# Patient Record
Sex: Female | Born: 1965 | Race: White | Hispanic: No | Marital: Married | Smoking: Former smoker
Health system: Southern US, Community
[De-identification: ages and names within clinical notes are randomized; demographics above are authoritative.]

## PROBLEM LIST (undated history)

## (undated) DIAGNOSIS — E119 Type 2 diabetes mellitus without complications: Secondary | ICD-10-CM

## (undated) DIAGNOSIS — I10 Essential (primary) hypertension: Secondary | ICD-10-CM

## (undated) DIAGNOSIS — T8859XA Other complications of anesthesia, initial encounter: Secondary | ICD-10-CM

## (undated) DIAGNOSIS — K429 Umbilical hernia without obstruction or gangrene: Secondary | ICD-10-CM

## (undated) DIAGNOSIS — T4145XA Adverse effect of unspecified anesthetic, initial encounter: Secondary | ICD-10-CM

## (undated) DIAGNOSIS — K219 Gastro-esophageal reflux disease without esophagitis: Secondary | ICD-10-CM

## (undated) HISTORY — PX: LUMBAR DISC SURGERY: SHX700

## (undated) HISTORY — PX: ADENOIDECTOMY: SUR15

---

## 2003-10-27 ENCOUNTER — Ambulatory Visit: Payer: Self-pay | Admitting: Family Medicine

## 2005-04-06 ENCOUNTER — Ambulatory Visit: Payer: Self-pay | Admitting: Family Medicine

## 2007-08-28 ENCOUNTER — Ambulatory Visit: Payer: Self-pay

## 2007-09-06 ENCOUNTER — Ambulatory Visit: Payer: Self-pay

## 2008-05-04 ENCOUNTER — Ambulatory Visit: Payer: Self-pay | Admitting: Family Medicine

## 2008-11-05 ENCOUNTER — Ambulatory Visit: Payer: Self-pay | Admitting: Obstetrics and Gynecology

## 2009-01-09 HISTORY — PX: BREAST LUMPECTOMY: SHX2

## 2009-05-25 ENCOUNTER — Ambulatory Visit: Payer: Self-pay

## 2009-06-04 ENCOUNTER — Ambulatory Visit: Payer: Self-pay | Admitting: Surgery

## 2009-06-10 ENCOUNTER — Ambulatory Visit: Payer: Self-pay | Admitting: Surgery

## 2010-07-01 ENCOUNTER — Ambulatory Visit: Payer: Self-pay

## 2011-09-13 ENCOUNTER — Ambulatory Visit: Payer: Self-pay

## 2012-10-09 ENCOUNTER — Ambulatory Visit: Payer: Self-pay

## 2013-11-27 ENCOUNTER — Ambulatory Visit: Payer: Self-pay

## 2014-11-04 ENCOUNTER — Other Ambulatory Visit: Payer: Self-pay | Admitting: Obstetrics and Gynecology

## 2014-11-04 DIAGNOSIS — Z1231 Encounter for screening mammogram for malignant neoplasm of breast: Secondary | ICD-10-CM

## 2014-11-30 ENCOUNTER — Ambulatory Visit
Admission: RE | Admit: 2014-11-30 | Discharge: 2014-11-30 | Disposition: A | Discharge status: Home / Self Care | Payer: BC Managed Care – PPO | Source: Ambulatory Visit | Attending: Obstetrics and Gynecology | Admitting: Obstetrics and Gynecology

## 2014-11-30 ENCOUNTER — Other Ambulatory Visit: Payer: Self-pay | Admitting: Obstetrics and Gynecology

## 2014-11-30 DIAGNOSIS — Z1231 Encounter for screening mammogram for malignant neoplasm of breast: Secondary | ICD-10-CM | POA: Insufficient documentation

## 2015-11-11 ENCOUNTER — Other Ambulatory Visit: Payer: Self-pay | Admitting: Obstetrics and Gynecology

## 2015-11-11 DIAGNOSIS — Z1231 Encounter for screening mammogram for malignant neoplasm of breast: Secondary | ICD-10-CM

## 2015-12-23 ENCOUNTER — Ambulatory Visit
Admission: RE | Admit: 2015-12-23 | Discharge: 2015-12-23 | Disposition: A | Discharge status: Home / Self Care | Payer: BC Managed Care – PPO | Source: Ambulatory Visit | Attending: Obstetrics and Gynecology | Admitting: Obstetrics and Gynecology

## 2015-12-23 DIAGNOSIS — Z1231 Encounter for screening mammogram for malignant neoplasm of breast: Secondary | ICD-10-CM | POA: Diagnosis present

## 2016-11-14 ENCOUNTER — Other Ambulatory Visit: Payer: Self-pay | Admitting: Obstetrics and Gynecology

## 2016-11-14 DIAGNOSIS — Z1231 Encounter for screening mammogram for malignant neoplasm of breast: Secondary | ICD-10-CM

## 2017-01-17 ENCOUNTER — Ambulatory Visit
Admission: RE | Admit: 2017-01-17 | Discharge: 2017-01-17 | Disposition: A | Discharge status: Home / Self Care | Payer: BC Managed Care – PPO | Source: Ambulatory Visit | Attending: Obstetrics and Gynecology | Admitting: Obstetrics and Gynecology

## 2017-01-17 DIAGNOSIS — Z1231 Encounter for screening mammogram for malignant neoplasm of breast: Secondary | ICD-10-CM | POA: Insufficient documentation

## 2017-01-17 NOTE — H&P (Signed)
Julie Marks is a 52 y.o. female here for Follow-up (u/s follow up pmb) . HPI:  Pt presents for a preoperative visit to schedule a D&C, hysteroscopy, and polypectomy.  F/u for Abnormal uterine bleeding, present intermittently for 2 years.  FSH on 11/14/16 = 40.1  She has a hx of: BMI 46  Workup has included: tvus today  Fibroid ut seen  1 fundal lt=25 mm 2 hyperechoic mass rt lat=12 mm - appears solid with   Endometrium irregular Thickened=15.63 mm  Neither ov seen    No EMBX, because if neg I still will want a thorough endometrial assessment  Past Medical History:  has a past medical history of Diabetes mellitus type 2, uncomplicated (CMS-HCC), Dyspepsia, Gout, History of cyst of breast, History of hemorrhoids, Hyperlipidemia, unspecified, Hypertension, Obesity, unspecified, and Osteoarthrosis.  Past Surgical History:  has a past surgical history that includes Unlisted Procedure Pharynx/Adenoids/Tonsils (1968) and lumb discectomy cervicle/lumbar multiple levels (2006). Family History: family history includes Breast cancer in her mother; Diabetes in her father and mother; Heart disease in her father and mother; High blood pressure (Hypertension) in her father and mother; Lung cancer in her father. Social History:  reports that she has never smoked. She has never used smokeless tobacco. She reports that she drinks alcohol. She reports that she does not use drugs. OB/GYN History:          OB History    Gravida  0   Para  0   Term  0   Preterm  0   AB  0   Living  0     SAB  0   TAB  0   Ectopic  0   Molar      Multiple  0   Live Births             Allergies: is allergic to nitrous oxide and others. Medications:  Current Outpatient Medications:  .  allopurinol (ZYLOPRIM) 100 MG tablet, , Disp: , Rfl: 5 .  amLODIPine (NORVASC) 10 MG tablet, Take 10 mg by mouth., Disp: , Rfl:  .  losartan (COZAAR) 100 MG tablet, Take 100 mg by mouth.,  Disp: , Rfl:  .  metFORMIN (GLUCOPHAGE) 1000 MG tablet, 500 mg once daily. TAKE 1 TABLET BY MOUTH ONCE DAILY. , Disp: , Rfl:  .  omega-3 fatty acids/fish oil 340-1,000 mg capsule, Take 1 capsule by mouth 2 (two) times daily., Disp: , Rfl:  .  omeprazole (PRILOSEC) 20 MG DR capsule, Take 20 mg by mouth once daily., Disp: , Rfl:  .  sertraline (ZOLOFT) 100 MG tablet, , Disp: , Rfl: 4 .  sertraline (ZOLOFT) 25 MG tablet, Take 25 mg by mouth once daily., Disp: , Rfl:  .  turmeric-turmeric root extract 450-50 mg Cap, Take by mouth., Disp: , Rfl:   Review of Systems: No SOB, no palpitations or chest pain, no new lower extremity edema, no nausea or vomiting or bowel or bladder complaints. See HPI for gyn specific ROS.   Exam:   Vitals:   01/17/17 1138  BP: (!) 168/99  Pulse: 76    WDWN   female in NAD Body mass index is 46.22 kg/m (pended).  General: Patient is well-groomed, well-nourished, appears stated age in no acute distress  HEENT: head is atraumatic and normocephalic, trachea is midline, neck is supple with no palpable nodules  CV: Regular rhythm and normal heart rate, no murmur  Pulm: Clear to auscultation throughout lung fields with no wheezing,  crackles, or rhonchi. No increased work of breathing  Abdomen: soft , no mass, non-tender, no rebound tenderness, no hepatomegaly  Pelvic: Deferred  Impression:   The primary encounter diagnosis was Endometrial stripe increased. A diagnosis of Post-menopausal bleeding was also pertinent to this visit.    Plan:   -  Preoperative visit: D&C hysteroscopy, with possible myosure. Consents signed today. Risks of surgery were discussed with the patient including but not limited to: bleeding which may require transfusion; infection which may require antibiotics; injury to uterus or surrounding organs; intrauterine scarring which may impair future fertility; need for additional procedures including laparotomy or  laparoscopy; and other postoperative/anesthesia complications. Written informed consent was obtained.  This is a scheduled same-day surgery. She will have a postop visit in 2 weeks to review operative findings and pathology.

## 2017-01-18 ENCOUNTER — Encounter
Admission: RE | Admit: 2017-01-18 | Discharge: 2017-01-18 | Disposition: A | Discharge status: Home / Self Care | Payer: BC Managed Care – PPO | Source: Ambulatory Visit | Attending: Obstetrics and Gynecology | Admitting: Obstetrics and Gynecology

## 2017-01-18 ENCOUNTER — Other Ambulatory Visit: Payer: Self-pay

## 2017-01-18 HISTORY — DX: Other complications of anesthesia, initial encounter: T88.59XA

## 2017-01-18 HISTORY — DX: Gastro-esophageal reflux disease without esophagitis: K21.9

## 2017-01-18 HISTORY — DX: Essential (primary) hypertension: I10

## 2017-01-18 HISTORY — DX: Umbilical hernia without obstruction or gangrene: K42.9

## 2017-01-18 HISTORY — DX: Adverse effect of unspecified anesthetic, initial encounter: T41.45XA

## 2017-01-18 HISTORY — DX: Type 2 diabetes mellitus without complications: E11.9

## 2017-01-18 NOTE — Patient Instructions (Signed)
  Your procedure is scheduled on: 01-19-17 Report to Same Day Surgery 2nd floor medical mall [facility](Medical Mall Entrance-take elevator on left to 2nd floor.  Check in with surgery information desk.) @ 11:30 AM PER PT  Remember: Instructions that are not followed completely may result in serious medical risk, up to and including death, or upon the discretion of your surgeon and anesthesiologist your surgery may need to be rescheduled.    _x___ 1. Do not eat food after midnight the night before your procedure. NO GUM OR CANDY AFTER MIDNIGHT.  You may drink WATER up to 2 hours before you are scheduled to arrive at the hospital for your procedure.  Do not drink WATER within 2 hours of your scheduled arrival to the hospital.  Type 1 and type 2 diabetics should only drink water.      __x__ 2. No Alcohol for 24 hours before or after surgery.   __x__3. No Smoking for 24 prior to surgery.   ____  4. Bring all medications with you on the day of surgery if instructed.    __x__ 5. Notify your doctor if there is any change in your medical condition     (cold, fever, infections).     Do not wear jewelry, make-up, hairpins, clips or nail polish.  Do not wear lotions, powders, or perfumes. You may wear deodorant.  Do not shave 48 hours prior to surgery. Men may shave face and neck.  Do not bring valuables to the hospital.    [facility] any belongings or valuables.               Contacts, dentures or bridgework may not be worn into surgery.  Leave your suitcase in the car. After surgery it may be brought to your room.  For patients admitted to the hospital, discharge time is determined by your treatment team.   Patients discharged the day of surgery will not be allowed to drive home.  You will need someone to drive you home and stay with you the night of your procedure.     _x___ TAKE THE FOLLOWING MEDICATIONS WITH A SMALL SIP OF WATER. These include:  1. ALLOPURINOL  2.  AMLODIPINE  3. PRILOSEC  4. ZOLOFT  5.  6.  ____Fleets enema or Magnesium Citrate as directed.   ____ Use CHG Soap or sage wipes as directed on instruction sheet   ____ Use inhalers on the day of surgery and bring to hospital day of surgery  __X__ Stop Metformin 2 days prior to surgery-PT LAST HAD METFORMIN TODAY IN AM (01-18-17)    ____ Take 1/2 of usual insulin dose the night before surgery and none on the morning surgery.   ____ Follow recommendations from Cardiologist, Pulmonologist or PCP regarding  stopping Aspirin, Coumadin, Plavix ,Eliquis, Effient, or Pradaxa, and Pletal.  X____Stop Anti-inflammatories such as Advil, Aleve, Ibuprofen, Motrin, Naproxen, Naprosyn, Goodies powders or aspirin products NOW-OK to take Tylenol    ____ Stop supplements until after surgery.    ____ Bring C-Pap to the hospital.

## 2017-01-19 ENCOUNTER — Encounter
Admission: RE | Disposition: A | Discharge status: Home / Self Care | Payer: Self-pay | Source: Ambulatory Visit | Attending: Obstetrics and Gynecology

## 2017-01-19 ENCOUNTER — Ambulatory Visit
Admission: RE | Admit: 2017-01-19 | Discharge: 2017-01-19 | Disposition: A | Discharge status: Home / Self Care | Payer: BC Managed Care – PPO | Source: Ambulatory Visit | Attending: Obstetrics and Gynecology | Admitting: Obstetrics and Gynecology

## 2017-01-19 ENCOUNTER — Other Ambulatory Visit: Payer: Self-pay

## 2017-01-19 ENCOUNTER — Ambulatory Visit: Payer: BC Managed Care – PPO | Admitting: Anesthesiology

## 2017-01-19 ENCOUNTER — Encounter: Payer: Self-pay | Admitting: *Deleted

## 2017-01-19 DIAGNOSIS — Z79899 Other long term (current) drug therapy: Secondary | ICD-10-CM | POA: Insufficient documentation

## 2017-01-19 DIAGNOSIS — Z6841 Body Mass Index (BMI) 40.0 and over, adult: Secondary | ICD-10-CM | POA: Insufficient documentation

## 2017-01-19 DIAGNOSIS — M1990 Unspecified osteoarthritis, unspecified site: Secondary | ICD-10-CM | POA: Diagnosis not present

## 2017-01-19 DIAGNOSIS — N8500 Endometrial hyperplasia, unspecified: Secondary | ICD-10-CM | POA: Insufficient documentation

## 2017-01-19 DIAGNOSIS — Z7984 Long term (current) use of oral hypoglycemic drugs: Secondary | ICD-10-CM | POA: Diagnosis not present

## 2017-01-19 DIAGNOSIS — E119 Type 2 diabetes mellitus without complications: Secondary | ICD-10-CM | POA: Diagnosis not present

## 2017-01-19 DIAGNOSIS — N950 Postmenopausal bleeding: Secondary | ICD-10-CM | POA: Diagnosis present

## 2017-01-19 DIAGNOSIS — I10 Essential (primary) hypertension: Secondary | ICD-10-CM | POA: Diagnosis not present

## 2017-01-19 DIAGNOSIS — E669 Obesity, unspecified: Secondary | ICD-10-CM | POA: Insufficient documentation

## 2017-01-19 DIAGNOSIS — E785 Hyperlipidemia, unspecified: Secondary | ICD-10-CM | POA: Insufficient documentation

## 2017-01-19 DIAGNOSIS — N840 Polyp of corpus uteri: Secondary | ICD-10-CM | POA: Diagnosis not present

## 2017-01-19 HISTORY — PX: DILATATION & CURETTAGE/HYSTEROSCOPY WITH MYOSURE: SHX6511

## 2017-01-19 LAB — CBC
HCT: 41 % (ref 35.0–47.0)
HEMOGLOBIN: 13.5 g/dL (ref 12.0–16.0)
MCH: 27.7 pg (ref 26.0–34.0)
MCHC: 32.9 g/dL (ref 32.0–36.0)
MCV: 84.2 fL (ref 80.0–100.0)
PLATELETS: 195 10*3/uL (ref 150–440)
RBC: 4.87 MIL/uL (ref 3.80–5.20)
RDW: 15.9 % — ABNORMAL HIGH (ref 11.5–14.5)
WBC: 8.6 10*3/uL (ref 3.6–11.0)

## 2017-01-19 LAB — BASIC METABOLIC PANEL
ANION GAP: 9 (ref 5–15)
BUN: 17 mg/dL (ref 6–20)
CHLORIDE: 110 mmol/L (ref 101–111)
CO2: 23 mmol/L (ref 22–32)
Calcium: 9.3 mg/dL (ref 8.9–10.3)
Creatinine, Ser: 0.93 mg/dL (ref 0.44–1.00)
Glucose, Bld: 112 mg/dL — ABNORMAL HIGH (ref 65–99)
POTASSIUM: 3.9 mmol/L (ref 3.5–5.1)
SODIUM: 142 mmol/L (ref 135–145)

## 2017-01-19 LAB — GLUCOSE, CAPILLARY
GLUCOSE-CAPILLARY: 104 mg/dL — AB (ref 65–99)
Glucose-Capillary: 91 mg/dL (ref 65–99)

## 2017-01-19 LAB — TYPE AND SCREEN
ABO/RH(D): O POS
ANTIBODY SCREEN: NEGATIVE

## 2017-01-19 LAB — POCT PREGNANCY, URINE: PREG TEST UR: NEGATIVE

## 2017-01-19 LAB — ABO/RH: ABO/RH(D): O POS

## 2017-01-19 SURGERY — DILATATION & CURETTAGE/HYSTEROSCOPY WITH MYOSURE
Anesthesia: Spinal | Site: Cervix | Wound class: Clean Contaminated

## 2017-01-19 MED ORDER — PROPOFOL 500 MG/50ML IV EMUL
INTRAVENOUS | Status: DC | PRN
  Administered 2017-01-19: 50 ug/kg/min via INTRAVENOUS

## 2017-01-19 MED ORDER — GABAPENTIN 800 MG PO TABS: 800.0000 mg | ORAL_TABLET | Freq: Every day | ORAL | 0 refills | Status: AC

## 2017-01-19 MED ORDER — ONDANSETRON HCL 4 MG/2ML IJ SOLN: 4.0000 mg | Freq: Once | INTRAMUSCULAR | Status: DC | PRN

## 2017-01-19 MED ORDER — MIDAZOLAM HCL 5 MG/5ML IJ SOLN
INTRAMUSCULAR | Status: DC | PRN
  Administered 2017-01-19 (×2): 1 mg via INTRAVENOUS

## 2017-01-19 MED ORDER — DEXAMETHASONE SODIUM PHOSPHATE 10 MG/ML IJ SOLN
INTRAMUSCULAR | Status: AC
  Filled 2017-01-19: qty 1

## 2017-01-19 MED ORDER — PROPOFOL 10 MG/ML IV BOLUS
INTRAVENOUS | Status: AC
  Filled 2017-01-19: qty 20

## 2017-01-19 MED ORDER — FENTANYL CITRATE (PF) 100 MCG/2ML IJ SOLN
INTRAMUSCULAR | Status: AC
  Filled 2017-01-19: qty 2

## 2017-01-19 MED ORDER — DOCUSATE SODIUM 100 MG PO CAPS: 100.0000 mg | ORAL_CAPSULE | Freq: Two times a day (BID) | ORAL | 0 refills | Status: AC

## 2017-01-19 MED ORDER — FENTANYL CITRATE (PF) 100 MCG/2ML IJ SOLN
INTRAMUSCULAR | Status: DC | PRN
  Administered 2017-01-19 (×2): 25 ug via INTRAVENOUS

## 2017-01-19 MED ORDER — GLYCOPYRROLATE 0.2 MG/ML IJ SOLN
INTRAMUSCULAR | Status: AC
  Filled 2017-01-19: qty 1

## 2017-01-19 MED ORDER — IBUPROFEN 800 MG PO TABS: 800.0000 mg | ORAL_TABLET | Freq: Three times a day (TID) | ORAL | 1 refills | Status: AC | PRN

## 2017-01-19 MED ORDER — MIDAZOLAM HCL 2 MG/2ML IJ SOLN
INTRAMUSCULAR | Status: AC
  Filled 2017-01-19: qty 2

## 2017-01-19 MED ORDER — KETOROLAC TROMETHAMINE 30 MG/ML IJ SOLN
INTRAMUSCULAR | Status: DC | PRN
  Administered 2017-01-19: 30 mg via INTRAVENOUS

## 2017-01-19 MED ORDER — SUCCINYLCHOLINE CHLORIDE 20 MG/ML IJ SOLN
INTRAMUSCULAR | Status: AC
  Filled 2017-01-19: qty 1

## 2017-01-19 MED ORDER — KETOROLAC TROMETHAMINE 30 MG/ML IJ SOLN
INTRAMUSCULAR | Status: AC
  Filled 2017-01-19: qty 1

## 2017-01-19 MED ORDER — LIDOCAINE HCL (PF) 2 % IJ SOLN
INTRAMUSCULAR | Status: AC
  Filled 2017-01-19: qty 10

## 2017-01-19 MED ORDER — ONDANSETRON HCL 4 MG/2ML IJ SOLN
INTRAMUSCULAR | Status: AC
  Filled 2017-01-19: qty 2

## 2017-01-19 MED ORDER — LIDOCAINE HCL (PF) 1 % IJ SOLN
INTRAMUSCULAR | Status: AC
  Filled 2017-01-19: qty 30

## 2017-01-19 MED ORDER — BUPIVACAINE IN DEXTROSE 0.75-8.25 % IT SOLN
INTRATHECAL | Status: DC | PRN
  Administered 2017-01-19: 1.1 mL via INTRATHECAL

## 2017-01-19 MED ORDER — LIDOCAINE-EPINEPHRINE 1 %-1:100000 IJ SOLN
INTRAMUSCULAR | Status: AC
  Filled 2017-01-19: qty 1

## 2017-01-19 MED ORDER — PROPOFOL 10 MG/ML IV BOLUS
INTRAVENOUS | Status: DC | PRN
  Administered 2017-01-19: 50 mg via INTRAVENOUS

## 2017-01-19 MED ORDER — LACTATED RINGERS IV SOLN: INTRAVENOUS | Status: DC

## 2017-01-19 MED ORDER — FENTANYL CITRATE (PF) 100 MCG/2ML IJ SOLN: 25.0000 ug | INTRAMUSCULAR | Status: DC | PRN

## 2017-01-19 MED ORDER — SODIUM CHLORIDE 0.9 % IV SOLN
INTRAVENOUS | Status: DC
  Administered 2017-01-19: 14:00:00 via INTRAVENOUS

## 2017-01-19 SURGICAL SUPPLY — 20 items
ABLATOR ENDOMETRIAL MYOSURE (ABLATOR) ×3 IMPLANT
CANISTER SUC SOCK COL 7IN (MISCELLANEOUS) ×3 IMPLANT
CANISTER SUCT 3000ML PPV (MISCELLANEOUS) ×3 IMPLANT
CATH ROBINSON RED A/P 16FR (CATHETERS) ×3 IMPLANT
CUP MEDICINE 2OZ PLAST GRAD ST (MISCELLANEOUS) ×3 IMPLANT
DEVICE MYOSURE LITE (MISCELLANEOUS) IMPLANT
DRAPE SURG 17X11 SM STRL (DRAPES) ×3 IMPLANT
GLOVE BIO SURGEON STRL SZ7 (GLOVE) ×9 IMPLANT
GLOVE INDICATOR 7.5 STRL GRN (GLOVE) ×3 IMPLANT
GOWN STRL REUS W/ TWL LRG LVL3 (GOWN DISPOSABLE) ×2 IMPLANT
GOWN STRL REUS W/TWL LRG LVL3 (GOWN DISPOSABLE) ×4
KIT RM TURNOVER CYSTO AR (KITS) ×3 IMPLANT
PACK DNC HYST (MISCELLANEOUS) ×3 IMPLANT
PAD OB MATERNITY 4.3X12.25 (PERSONAL CARE ITEMS) ×3 IMPLANT
PAD PREP 24X41 OB/GYN DISP (PERSONAL CARE ITEMS) ×3 IMPLANT
SOL .9 NS 3000ML IRR  AL (IV SOLUTION) ×2
SOL .9 NS 3000ML IRR UROMATIC (IV SOLUTION) ×1 IMPLANT
TUBING CONNECTING 10 (TUBING) ×2 IMPLANT
TUBING CONNECTING 10' (TUBING) ×1
TUBING HYSTEROSCOPY DOLPHIN (MISCELLANEOUS) ×6 IMPLANT

## 2017-01-19 NOTE — OR Nursing (Signed)
Pt sitting up in recliner. States no discomfort. States bottom of feet remain numb. Eating and drinking well. Friend with pt. Denies discomfort.

## 2017-01-19 NOTE — OR Nursing (Signed)
Pt able to stand only with assistance on arrival to SDS. Discussed discharge instructions with pts friend. Voices understanding. Discussed with pt and friend need to walk and void before discharge. Both voice understanding.

## 2017-01-19 NOTE — OR Nursing (Signed)
Dr. Jarvis MorganAdam's has viewed 12 lead EKG from today. After discussion with Dr. Malena EdmanBeasly he reports he will do a spinal on this patient.

## 2017-01-19 NOTE — Anesthesia Procedure Notes (Signed)
Spinal  Patient location during procedure: OR Staffing Performed: anesthesiologist  Preanesthetic Checklist Completed: patient identified, site marked, surgical consent, pre-op evaluation, timeout performed, IV checked, risks and benefits discussed and monitors and equipment checked Spinal Block Patient position: sitting Prep: Betadine Patient monitoring: heart rate, continuous pulse ox, blood pressure and cardiac monitor Approach: midline Location: L4-5 Injection technique: single-shot Needle Needle type: Whitacre and Introducer  Needle gauge: 24 G Needle length: 10 cm Assessment Sensory level: T12 Additional Notes Negative paresthesia. Negative blood return. Positive free-flowing CSF. Expiration date of kit checked and confirmed. Patient tolerated procedure well, without complications.

## 2017-01-19 NOTE — Op Note (Signed)
Operative Report Hysteroscopy with Dilation and Curettage   Indications: Postmenopausal bleeding   Pre-operative Diagnosis: Thickened endometrial stripe   Post-operative Diagnosis: same.  Procedure: 1. Exam under anesthesia 2. Fractional D&C 3. Hysteroscopy 4. Myosure polypectomy  Surgeon: Christeen DouglasBethany Elynor Kallenberger, MD  Assistant(s):  None  Anesthesia: Spinal anesthesia and iv sedation  Anesthesiologist: No responsible provider has been recorded for the case. Anesthesiologist: Yevette EdwardsAdams, James G, MD CRNA: Ginger CarneMichelet, Stephanie, CRNA; Omer JackWeatherly, Janice, CRNA  Estimated Blood Loss:  Minimal         Intraoperative medications: toradol         Total IV Fluids: 700ml  Urine Output: 100ml  Total Fluid Deficit:  200 mL          Specimens: Endocervical curettings, endometrial curettings         Complications:  None; patient tolerated the procedure well.         Disposition: PACU - hemodynamically stable.         Condition: stable  Findings: Uterus measuring 7 cm by sound; normal cervix, vagina, perineum. Multiple endometrial polyp  Indication for procedure/Consents: 52 y.o. G0  here for scheduled surgery for the aforementioned diagnoses.  Risks of surgery were discussed with the patient including but not limited to: bleeding which may require transfusion; infection which may require antibiotics; injury to uterus or surrounding organs; intrauterine scarring which may impair future fertility; need for additional procedures including laparotomy or laparoscopy; and other postoperative/anesthesia complications. Written informed consent was obtained.     D&C/ Myosure  The patient was taken to the operating room where anesthesia was administered and was found to be adequate. After a formal and adequate timeout was performed, she was placed in the dorsal lithotomy position and examined with the above findings. She was then prepped and draped in the sterile manner. Her bladder was catheterized for  an estimated amount of clear, yellow urine. A weighed speculum was then placed in the patient's vagina and a single tooth tenaculum was applied to the anterior lip of the cervix.  Her cervix was serially dilated to 15 [place]French using Hanks dilators. An ECC was performed. The hysteroscope was introduced under direct observation  Using lactated ringers as a distention medium to reveal the above findings. The uterine cavity was carefully examined, both ostia were recognized, and diffusely proliferative endometrium with the polyp was noted.   This was resected using the Myosure device.  After further careful visualization of the uterine cavity, the hysteroscope was removed under direct visualization.  A sharp curettage was then performed until there was a gritty texture in all four quadrants. The tenaculum was removed from the anterior lip of the cervix and the vaginal speculum was removed after applying silver nitrate for good hemostasis.   The patient tolerated the procedure well and was taken to the recovery area awake and in stable condition. She received iv acetaminophen and Toradol prior to leaving the OR.  The patient will be discharged to home as per PACU criteria. Routine postoperative instructions given. She was prescribed Ibuprofen and Colace. She will follow up in the clinic in two weeks for postoperative evaluation.

## 2017-01-19 NOTE — OR Nursing (Signed)
Pt continues to be unable to feel bottom of feet. Dr Dalbert GarnetBeasley in to see pt..Marland Kitchen

## 2017-01-19 NOTE — Anesthesia Preprocedure Evaluation (Addendum)
Anesthesia Evaluation  Patient identified by MRN, date of birth, ID band Patient awake    Reviewed: Allergy & Precautions, H&P , NPO status , Patient's Chart, lab work & pertinent test results, reviewed documented beta blocker date and time   History of Anesthesia Complications (+) PONV and history of anesthetic complications  Airway Mallampati: II   Neck ROM: full    Dental  (+) Teeth Intact   Pulmonary neg pulmonary ROS, former smoker,    Pulmonary exam normal        Cardiovascular hypertension, negative cardio ROS Normal cardiovascular exam Rhythm:regular Rate:Normal     Neuro/Psych negative neurological ROS  negative psych ROS   GI/Hepatic negative GI ROS, Neg liver ROS, GERD  ,  Endo/Other  negative endocrine ROSdiabetes, Well Controlled, Type 2, Oral Hypoglycemic Agents  Renal/GU negative Renal ROS  negative genitourinary   Musculoskeletal   Abdominal   Peds  Hematology negative hematology ROS (+)   Anesthesia Other Findings Past Medical History: No date: Complication of anesthesia     Comment:  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 No date: Diabetes mellitus without complication (HCC) No date: GERD (gastroesophageal reflux disease) No date: Hypertension No date: Umbilical hernia Past Surgical History: No date: ADENOIDECTOMY     Comment:  AGE 52 2011: BREAST LUMPECTOMY; Right     Comment:  benign findings No date: LUMBAR DISC SURGERY   Reproductive/Obstetrics negative OB ROS                            Anesthesia Physical Anesthesia Plan  ASA: III  Anesthesia Plan: Spinal   Post-op Pain Management:    Induction:   PONV Risk Score and Plan: 4 or greater  Airway Management Planned:   Additional Equipment:   Intra-op Plan:   Post-operative Plan:   Informed Consent: I have reviewed the patients History and Physical, chart, labs and discussed the procedure including the  risks, benefits and alternatives for the proposed anesthesia with the patient or authorized representative who has indicated his/her understanding and acceptance.   Dental Advisory Given  Plan Discussed with: CRNA  Anesthesia Plan Comments: (ekg noted.  Pt with previous work up per Dr. Lady GaryFath and Neg. EST in early 2011.  No old ekg to compare to.  Hx eval yields no evidence on unstable angina or ischemia sx or change in cardiac ROS since her EST.  Pt has occasional dyspepsia but unrelated to activity.  I feel that her cardiac status is stable and based on my discussion with the patient and Dr. B, we will proceed with a saddle block for the above.  JA)      Anesthesia Quick Evaluation

## 2017-01-19 NOTE — Anesthesia Post-op Follow-up Note (Signed)
Anesthesia QCDR form completed.        

## 2017-01-19 NOTE — Transfer of Care (Signed)
Immediate Anesthesia Transfer of Care Note  Patient: Julie RobinsonLisa D Cavey  Procedure(s) Performed: DILATATION & CURETTAGE/HYSTEROSCOPY WITH MYOSURE (N/A Cervix)  Patient Location: PACU  Anesthesia Type:Spinal  Level of Consciousness: awake, alert  and oriented  Airway & Oxygen Therapy: Patient Spontanous Breathing  Post-op Assessment: Report given to RN and Post -op Vital signs reviewed and stable  Post vital signs: Reviewed and stable  Last Vitals:  Vitals:   01/19/17 1353 01/19/17 1808  BP: (!) 157/80 (!) 153/78  Pulse: 74 64  Resp: 18 14  Temp: 36.6 C 36.9 C  SpO2: 99% 98%    Last Pain:  Vitals:   01/19/17 1353  TempSrc: Temporal         Complications: No apparent anesthesia complications

## 2017-01-19 NOTE — Interval H&P Note (Signed)
History and Physical Interval Note:  01/19/2017 2:56 PM  Julie RobinsonLisa D Marks  has presented today for surgery, with the diagnosis of postmenopausal bleeding  The various methods of treatment have been discussed with the patient and family. After consideration of risks, benefits and other options for treatment, the patient has consented to  Procedure(s): DILATATION & CURETTAGE/HYSTEROSCOPY WITH MYOSURE (N/A) as a surgical intervention. 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 The patient's history has been reviewed, patient examined, no change in status, stable for surgery.  I have reviewed the patient's chart and labs.  Questions were answered to the patient's satisfaction.     Christeen DouglasBethany Lanny Donoso

## 2017-01-21 NOTE — Anesthesia Postprocedure Evaluation (Signed)
Anesthesia Post Note  Patient: Julie Marks  Procedure(s) Performed: DILATATION & CURETTAGE/HYSTEROSCOPY WITH MYOSURE (N/A Cervix)  Patient location during evaluation: PACU Anesthesia Type: Spinal Level of consciousness: awake and alert Pain management: pain level controlled Vital Signs Assessment: post-procedure vital signs reviewed and stable Respiratory status: spontaneous breathing, nonlabored ventilation, respiratory function stable and patient connected to nasal cannula oxygen Cardiovascular status: blood pressure returned to baseline and stable Postop Assessment: no apparent nausea or vomiting Anesthetic complications: no     Last Vitals:  Vitals:   01/19/17 1953 01/19/17 2100  BP: 138/64 133/69  Pulse: 66 81  Resp:  16  Temp:  36.7 C  SpO2: 100% 99%    Last Pain:  Vitals:   01/19/17 2100  TempSrc: Oral                 Yevette EdwardsJames G Keshun Berrett

## 2017-01-22 ENCOUNTER — Encounter: Payer: Self-pay | Admitting: Obstetrics and Gynecology

## 2017-01-23 LAB — SURGICAL PATHOLOGY

## 2017-11-21 ENCOUNTER — Other Ambulatory Visit: Payer: Self-pay | Admitting: Obstetrics and Gynecology

## 2017-11-21 DIAGNOSIS — Z1231 Encounter for screening mammogram for malignant neoplasm of breast: Secondary | ICD-10-CM

## 2018-01-18 ENCOUNTER — Ambulatory Visit
Admission: RE | Admit: 2018-01-18 | Discharge: 2018-01-18 | Disposition: A | Discharge status: Home / Self Care | Payer: BC Managed Care – PPO | Source: Ambulatory Visit | Attending: Obstetrics and Gynecology | Admitting: Obstetrics and Gynecology

## 2018-01-18 DIAGNOSIS — Z1231 Encounter for screening mammogram for malignant neoplasm of breast: Secondary | ICD-10-CM | POA: Insufficient documentation

## 2019-01-07 ENCOUNTER — Other Ambulatory Visit: Payer: Self-pay | Admitting: Obstetrics and Gynecology

## 2019-01-07 DIAGNOSIS — Z1231 Encounter for screening mammogram for malignant neoplasm of breast: Secondary | ICD-10-CM

## 2019-01-27 ENCOUNTER — Ambulatory Visit
Admission: RE | Admit: 2019-01-27 | Discharge: 2019-01-27 | Disposition: A | Discharge status: Home / Self Care | Payer: BC Managed Care – PPO | Source: Ambulatory Visit | Attending: Obstetrics and Gynecology | Admitting: Obstetrics and Gynecology

## 2019-01-27 DIAGNOSIS — Z1231 Encounter for screening mammogram for malignant neoplasm of breast: Secondary | ICD-10-CM | POA: Diagnosis present

## 2020-01-08 ENCOUNTER — Other Ambulatory Visit: Payer: Self-pay | Admitting: Obstetrics and Gynecology

## 2020-01-08 DIAGNOSIS — Z1231 Encounter for screening mammogram for malignant neoplasm of breast: Secondary | ICD-10-CM

## 2020-02-02 ENCOUNTER — Other Ambulatory Visit: Payer: Self-pay

## 2020-02-02 ENCOUNTER — Ambulatory Visit
Admission: RE | Admit: 2020-02-02 | Discharge: 2020-02-02 | Disposition: A | Discharge status: Home / Self Care | Payer: BC Managed Care – PPO | Source: Ambulatory Visit | Attending: Obstetrics and Gynecology | Admitting: Obstetrics and Gynecology

## 2020-02-02 DIAGNOSIS — Z1231 Encounter for screening mammogram for malignant neoplasm of breast: Secondary | ICD-10-CM | POA: Insufficient documentation

## 2021-01-18 ENCOUNTER — Other Ambulatory Visit: Payer: Self-pay | Admitting: Obstetrics and Gynecology

## 2021-01-18 DIAGNOSIS — Z1231 Encounter for screening mammogram for malignant neoplasm of breast: Secondary | ICD-10-CM

## 2021-02-15 ENCOUNTER — Other Ambulatory Visit: Payer: Self-pay

## 2021-02-15 ENCOUNTER — Ambulatory Visit
Admission: RE | Admit: 2021-02-15 | Discharge: 2021-02-15 | Disposition: A | Discharge status: Home / Self Care | Payer: BC Managed Care – PPO | Source: Ambulatory Visit | Attending: Obstetrics and Gynecology | Admitting: Obstetrics and Gynecology

## 2021-02-15 DIAGNOSIS — Z1231 Encounter for screening mammogram for malignant neoplasm of breast: Secondary | ICD-10-CM | POA: Diagnosis not present

## 2021-12-29 ENCOUNTER — Other Ambulatory Visit: Payer: Self-pay | Admitting: Obstetrics and Gynecology

## 2021-12-29 DIAGNOSIS — Z1231 Encounter for screening mammogram for malignant neoplasm of breast: Secondary | ICD-10-CM

## 2022-02-16 ENCOUNTER — Ambulatory Visit
Admission: RE | Admit: 2022-02-16 | Discharge: 2022-02-16 | Disposition: A | Discharge status: Home / Self Care | Payer: BC Managed Care – PPO | Source: Ambulatory Visit | Attending: Obstetrics and Gynecology | Admitting: Obstetrics and Gynecology

## 2022-02-16 DIAGNOSIS — Z1231 Encounter for screening mammogram for malignant neoplasm of breast: Secondary | ICD-10-CM | POA: Diagnosis present

## 2022-04-27 LAB — COLOGUARD: COLOGUARD: NEGATIVE

## 2022-04-27 LAB — EXTERNAL GENERIC LAB PROCEDURE: COLOGUARD: NEGATIVE

## 2023-02-02 ENCOUNTER — Other Ambulatory Visit: Payer: Self-pay | Admitting: Obstetrics and Gynecology

## 2023-02-02 DIAGNOSIS — Z1231 Encounter for screening mammogram for malignant neoplasm of breast: Secondary | ICD-10-CM

## 2023-02-21 ENCOUNTER — Ambulatory Visit
Admission: RE | Admit: 2023-02-21 | Discharge: 2023-02-21 | Disposition: A | Discharge status: Home / Self Care | Payer: 59 | Source: Ambulatory Visit | Attending: Obstetrics and Gynecology | Admitting: Obstetrics and Gynecology

## 2023-02-21 DIAGNOSIS — Z1231 Encounter for screening mammogram for malignant neoplasm of breast: Secondary | ICD-10-CM | POA: Diagnosis present

## 2023-05-16 ENCOUNTER — Other Ambulatory Visit: Payer: Self-pay | Admitting: Medical Genetics

## 2023-05-24 ENCOUNTER — Other Ambulatory Visit
Admission: RE | Admit: 2023-05-24 | Discharge: 2023-05-24 | Disposition: A | Discharge status: Home / Self Care | Payer: Self-pay | Source: Ambulatory Visit | Attending: Medical Genetics | Admitting: Medical Genetics

## 2023-06-05 LAB — GENECONNECT MOLECULAR SCREEN: Genetic Analysis Overall Interpretation: NEGATIVE

## 2023-09-19 IMAGING — MG MM DIGITAL SCREENING BILAT W/ TOMO AND CAD
6 of 12 series · 6 of 36 positions shown · non-contrast
Comparison: Previous exam(s).

CLINICAL DATA: Screening.

EXAM:
DIGITAL SCREENING BILATERAL MAMMOGRAM WITH TOMOSYNTHESIS AND CAD
TECHNIQUE: Bilateral screening digital craniocaudal and mediolateral oblique
mammograms were obtained. Bilateral screening digital breast
tomosynthesis was performed. The images were evaluated with
computer-aided detection.

[R MLO synth-2D (1 of 2)]
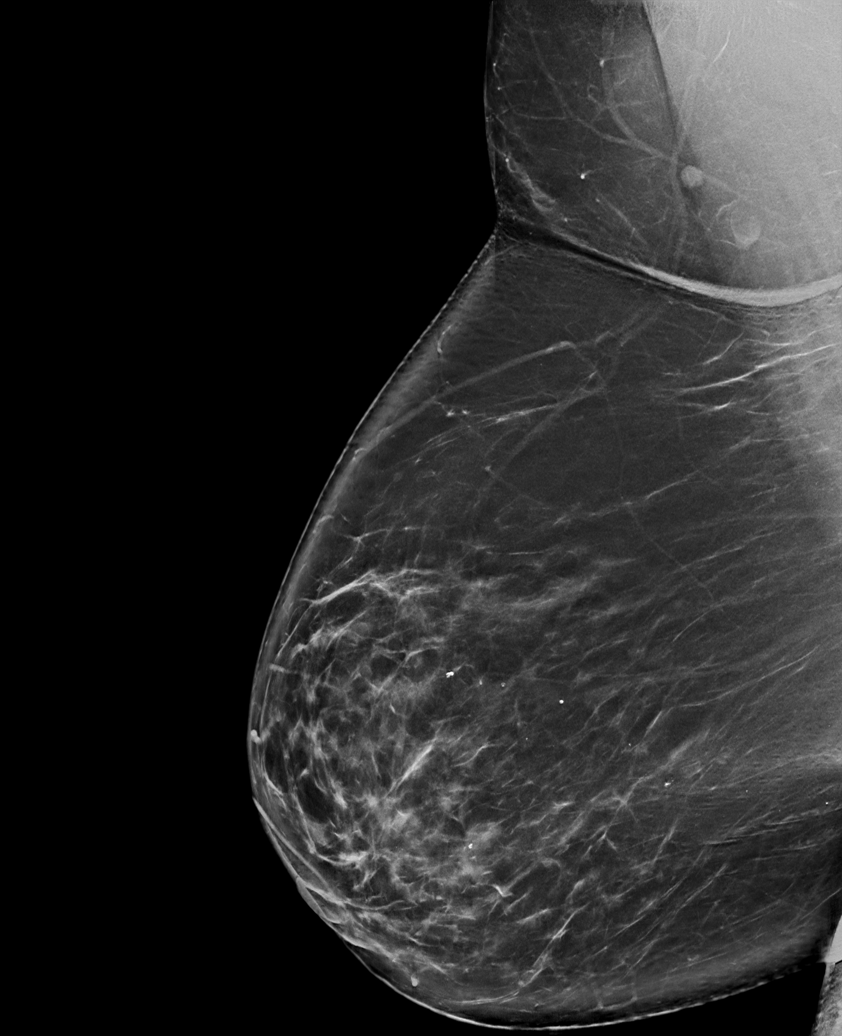

[R CC synth-2D (1 of 2)]
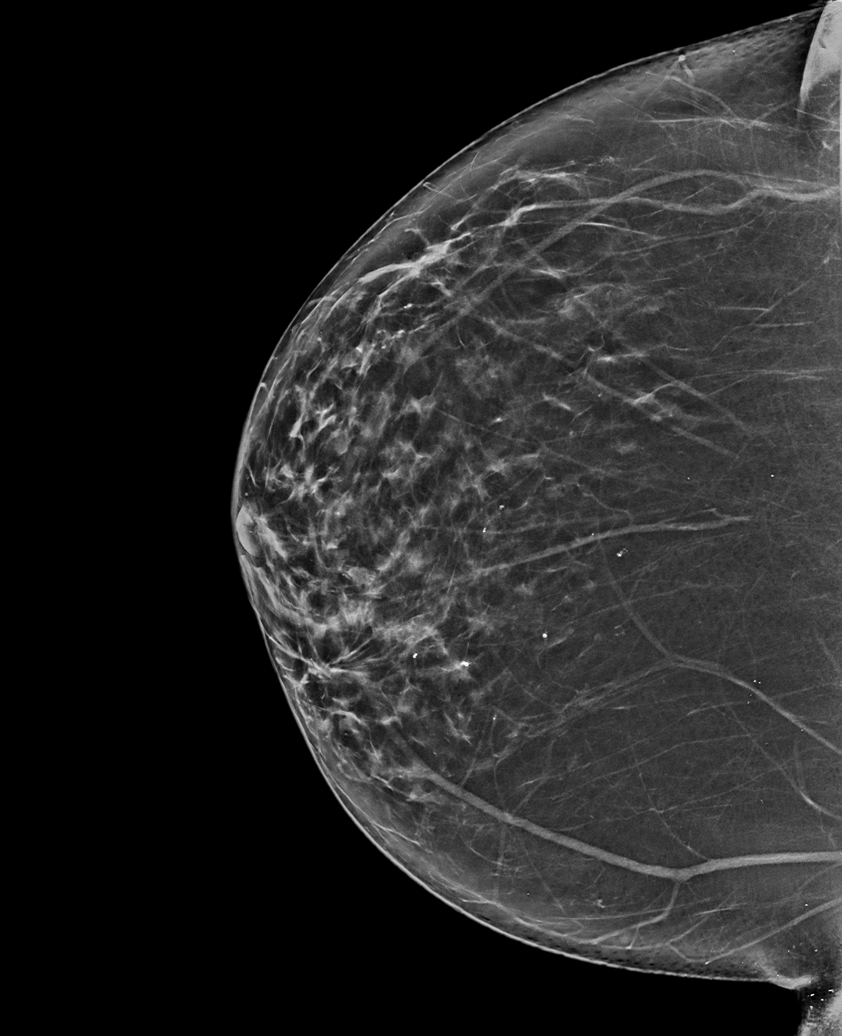

[R CC synth-2D (2 of 2)]
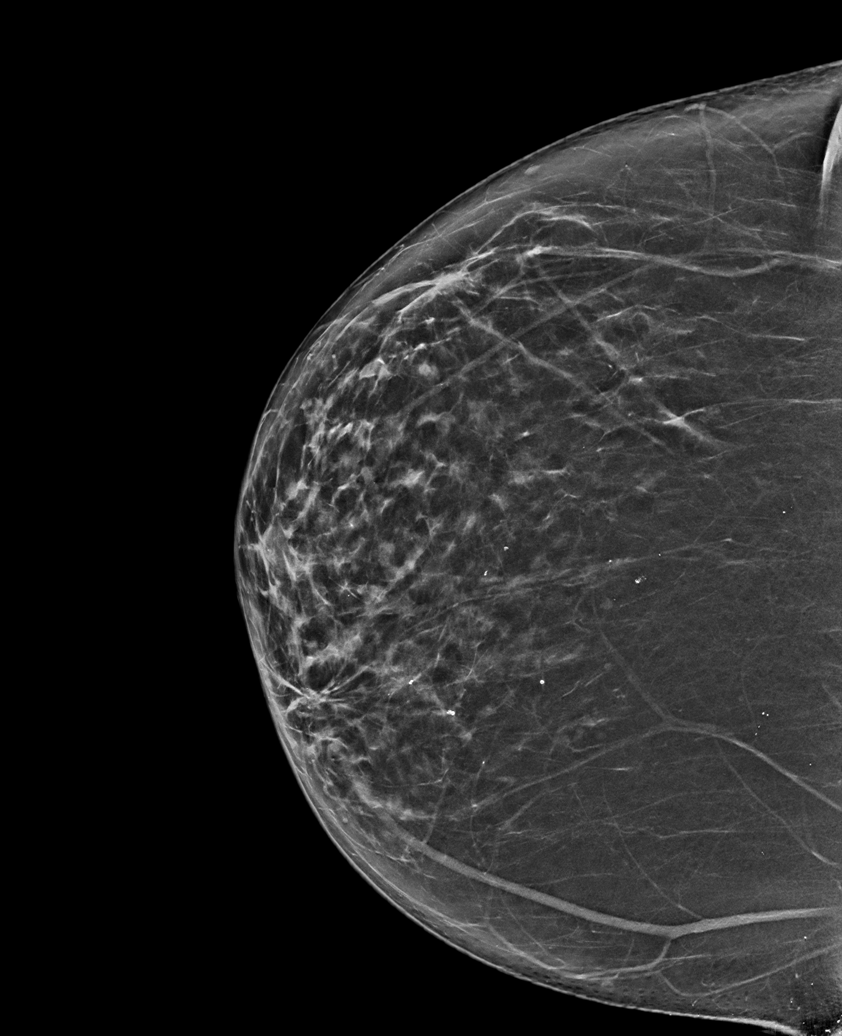

[L MLO synth-2D]
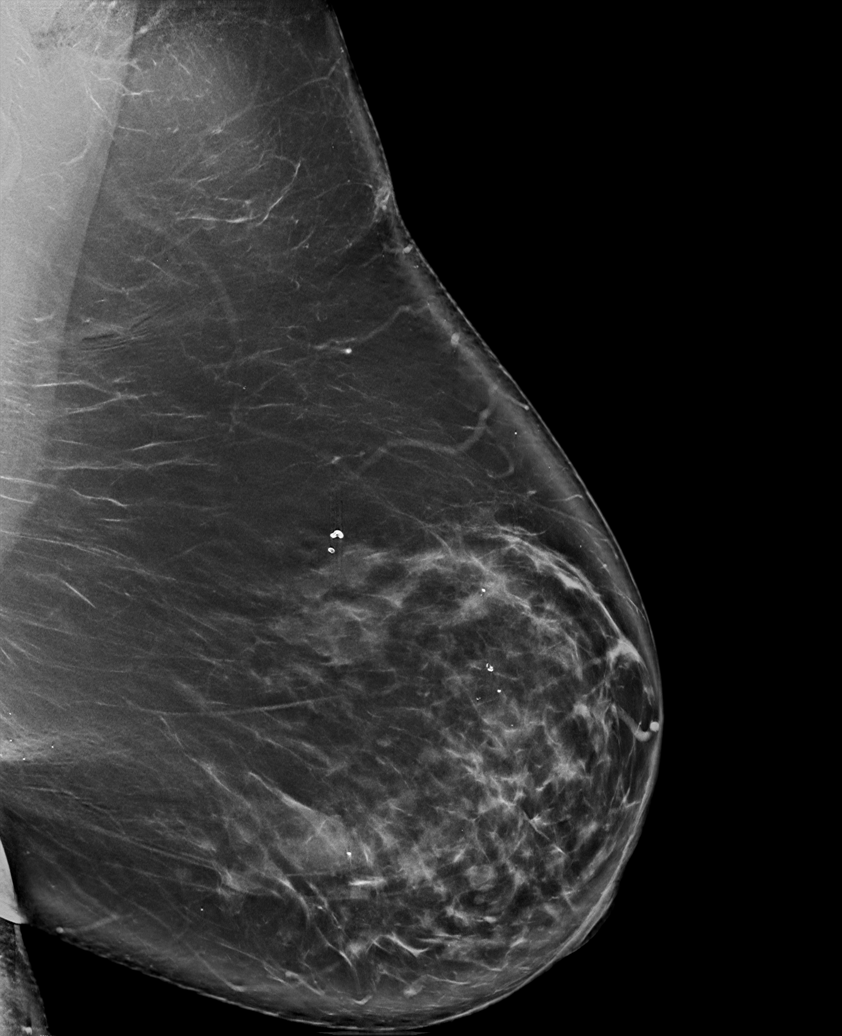

[R MLO synth-2D (2 of 2)]
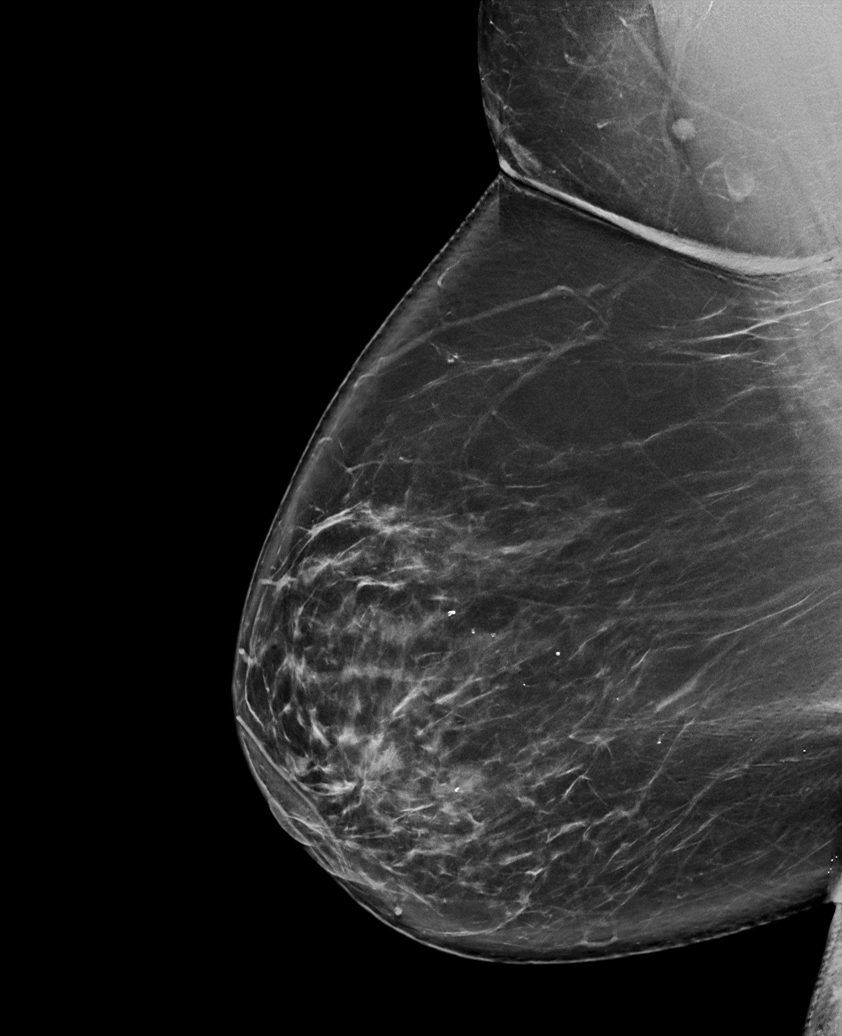

[L CC synth-2D]
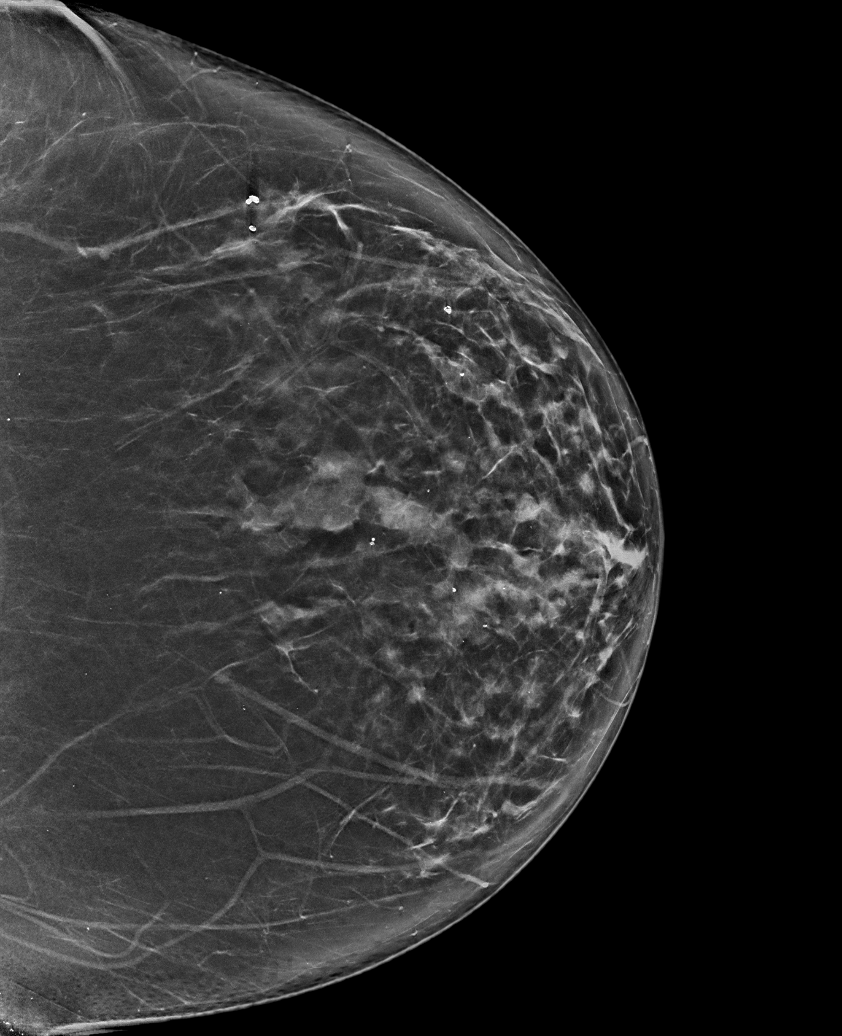

[6 of 36 positions shown; findings below may reference images not displayed]

ACR Breast Density Category c: The breast tissue is heterogeneously
dense, which may obscure small masses.
FINDINGS: There are no findings suspicious for malignancy.
IMPRESSION: No mammographic evidence of malignancy. A result letter of this
screening mammogram will be mailed directly to the patient.

RECOMMENDATION:
Screening mammogram in one year. (Code:Q3-W-BC3)

BI-RADS CATEGORY  1: Negative.

## 2024-01-23 ENCOUNTER — Other Ambulatory Visit: Payer: Self-pay | Admitting: Obstetrics and Gynecology

## 2024-01-23 DIAGNOSIS — Z1231 Encounter for screening mammogram for malignant neoplasm of breast: Secondary | ICD-10-CM

## 2024-02-25 ENCOUNTER — Encounter
